# Patient Record
Sex: Male | Born: 1965 | Race: White | Hispanic: No | Marital: Married | Smoking: Never smoker
Health system: Southern US, Community
[De-identification: ages and names within clinical notes are randomized; demographics above are authoritative.]

## PROBLEM LIST (undated history)

## (undated) DIAGNOSIS — I10 Essential (primary) hypertension: Secondary | ICD-10-CM

## (undated) DIAGNOSIS — R7303 Prediabetes: Secondary | ICD-10-CM

## (undated) DIAGNOSIS — G43909 Migraine, unspecified, not intractable, without status migrainosus: Secondary | ICD-10-CM

## (undated) DIAGNOSIS — E785 Hyperlipidemia, unspecified: Secondary | ICD-10-CM

## (undated) DIAGNOSIS — G4730 Sleep apnea, unspecified: Secondary | ICD-10-CM

## (undated) HISTORY — PX: NO PAST SURGERIES: SHX2092

---

## 2011-05-24 ENCOUNTER — Emergency Department: Payer: Self-pay | Admitting: Emergency Medicine

## 2011-10-28 ENCOUNTER — Emergency Department: Payer: Self-pay | Admitting: Emergency Medicine

## 2011-10-28 LAB — CBC WITH DIFFERENTIAL/PLATELET
Basophil %: 0.6 %
Eosinophil #: 0.2 10*3/uL (ref 0.0–0.7)
HCT: 41.4 % (ref 40.0–52.0)
Lymphocyte #: 2.8 10*3/uL (ref 1.0–3.6)
MCHC: 33.3 g/dL (ref 32.0–36.0)
MCV: 83 fL (ref 80–100)
Monocyte %: 10.4 %
Neutrophil %: 53.3 %
Platelet: 298 10*3/uL (ref 150–440)
WBC: 8.6 10*3/uL (ref 3.8–10.6)

## 2011-10-28 LAB — URINALYSIS, COMPLETE
Bilirubin,UR: NEGATIVE
Blood: NEGATIVE
Glucose,UR: NEGATIVE mg/dL (ref 0–75)
Ketone: NEGATIVE
Leukocyte Esterase: NEGATIVE
Ph: 5 (ref 4.5–8.0)
RBC,UR: 1 /HPF (ref 0–5)
Squamous Epithelial: NONE SEEN
WBC UR: 7 /HPF (ref 0–5)

## 2011-10-28 LAB — COMPREHENSIVE METABOLIC PANEL
Albumin: 3.9 g/dL (ref 3.4–5.0)
Anion Gap: 7 (ref 7–16)
BUN: 21 mg/dL — ABNORMAL HIGH (ref 7–18)
Calcium, Total: 9.1 mg/dL (ref 8.5–10.1)
Co2: 28 mmol/L (ref 21–32)
EGFR (African American): >60
Glucose: 81 mg/dL (ref 65–99)
Osmolality: 285 (ref 275–301)
Potassium: 3.9 mmol/L (ref 3.5–5.1)
SGPT (ALT): 29 U/L
Sodium: 142 mmol/L (ref 136–145)
Total Protein: 7.5 g/dL (ref 6.4–8.2)

## 2013-04-23 IMAGING — CT CT STONE STUDY
1 of 2 series · 15 of 32 positions shown, 19 images · non-contrast
Comparison: none

REASON FOR EXAM: right flank pain
COMMENTS:

[Series 2: stone · axial · 0.84mm/px · z∈[-98,+370]mm · 15 of 170 slices shown, 19 images]
[im 7/170  soft-tissue]
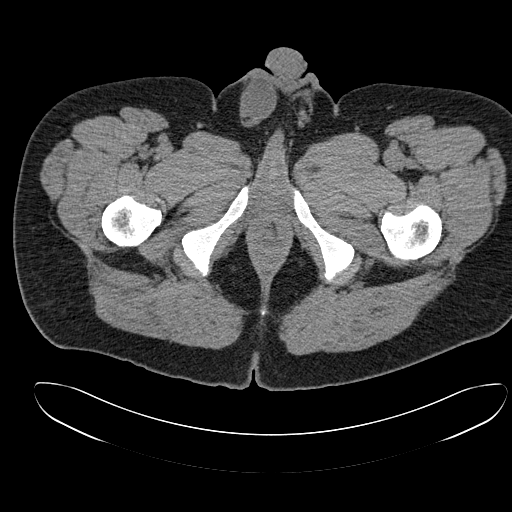
[im 7/170  bone]
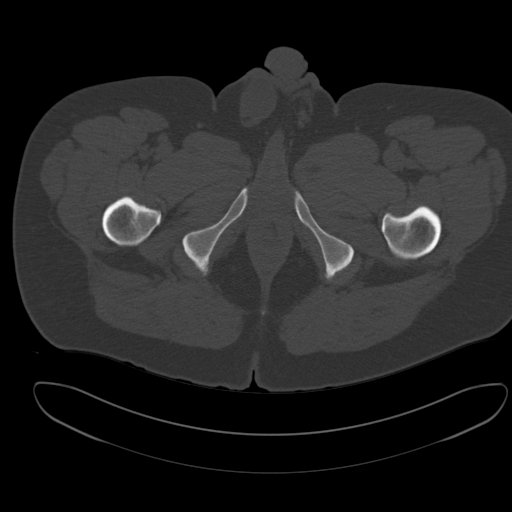
[im 20/170  soft-tissue]
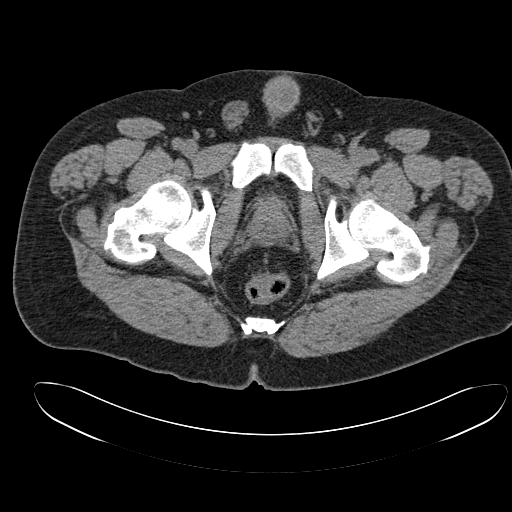
[im 33/170  soft-tissue]
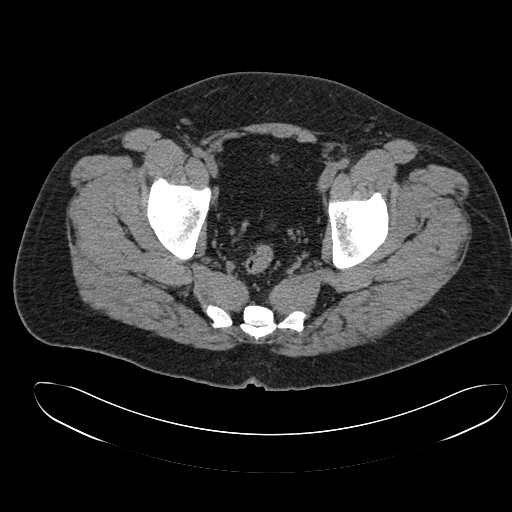
[im 46/170  soft-tissue]
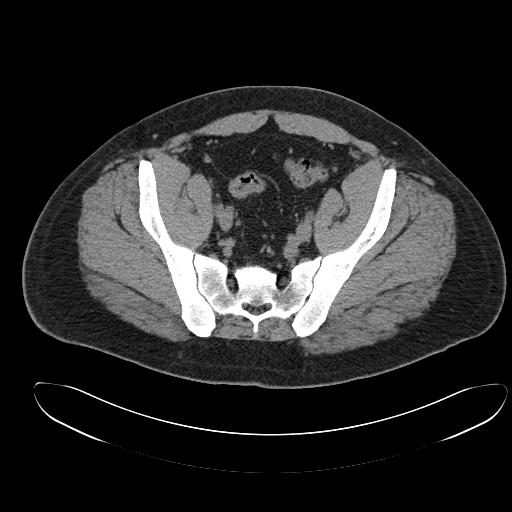
[im 59/170  soft-tissue]
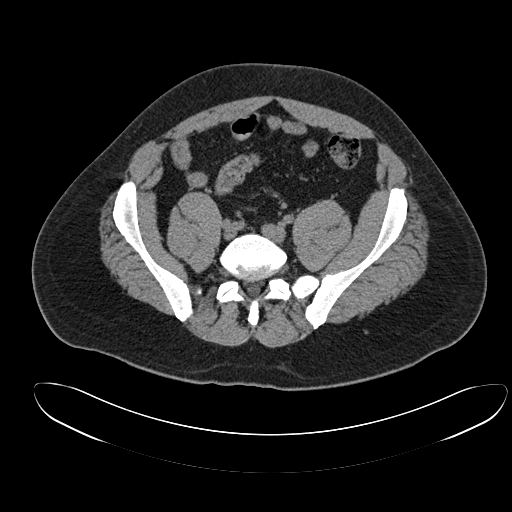
[im 72/170  soft-tissue]
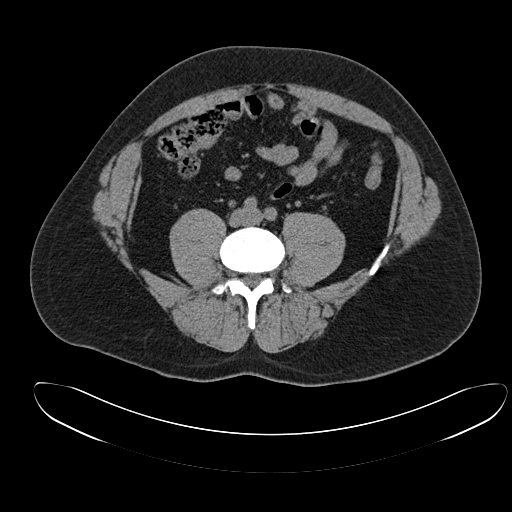
[im 85/170  soft-tissue]
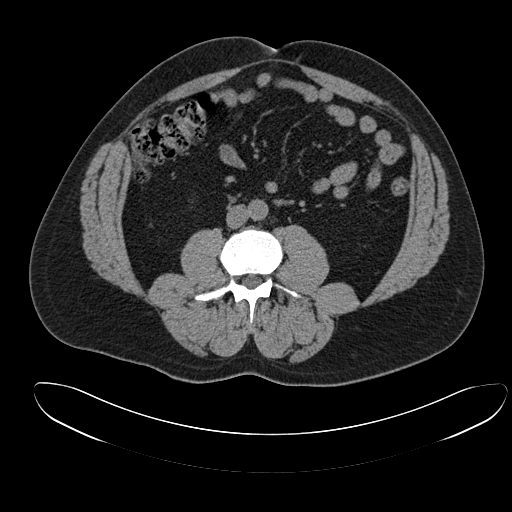
[im 98/170  soft-tissue]
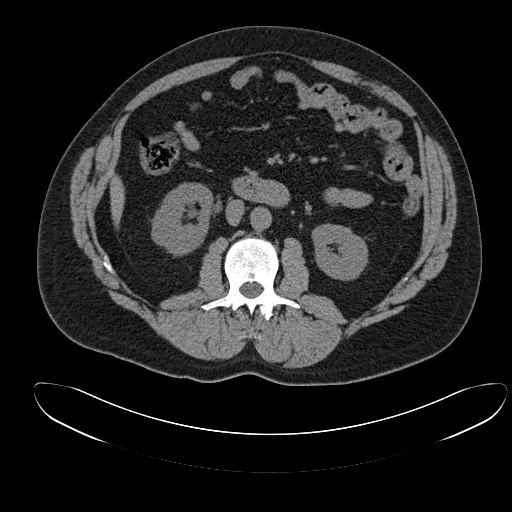
[im 111/170  soft-tissue]
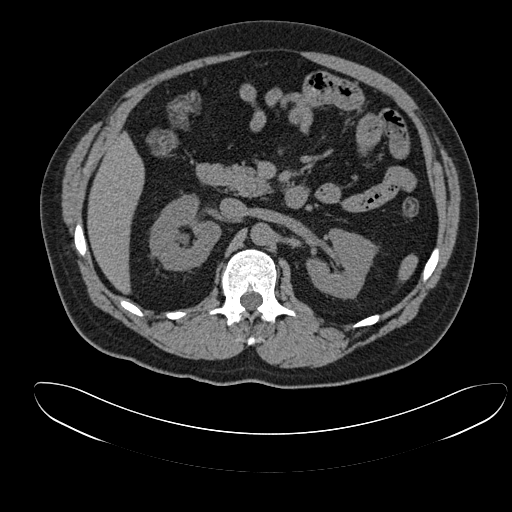
[im 111/170  bone]
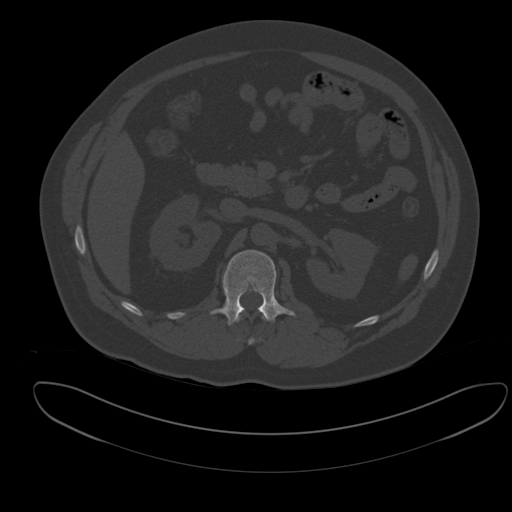
[im 124/170  soft-tissue]
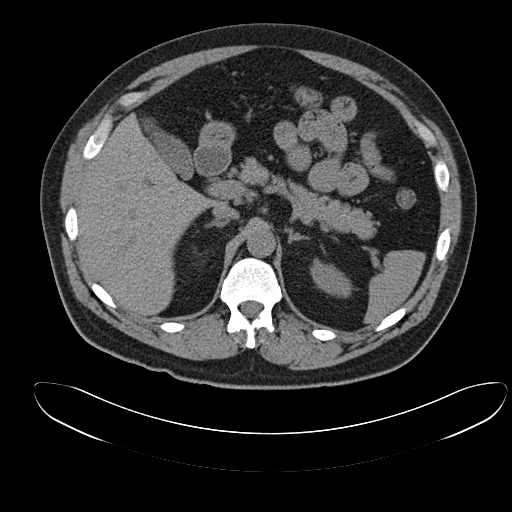
[im 137/170  soft-tissue]
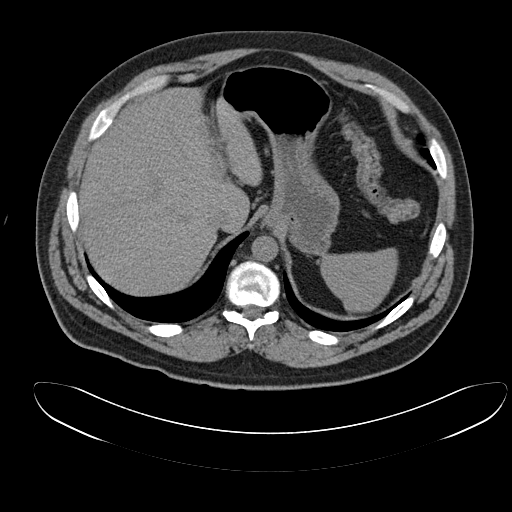
[im 144/170  lung]
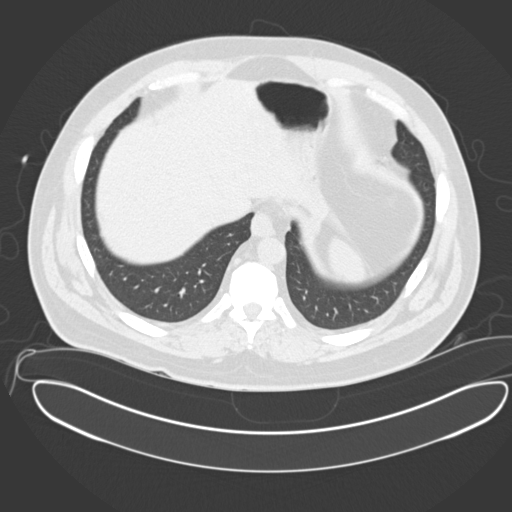
[im 150/170  soft-tissue]
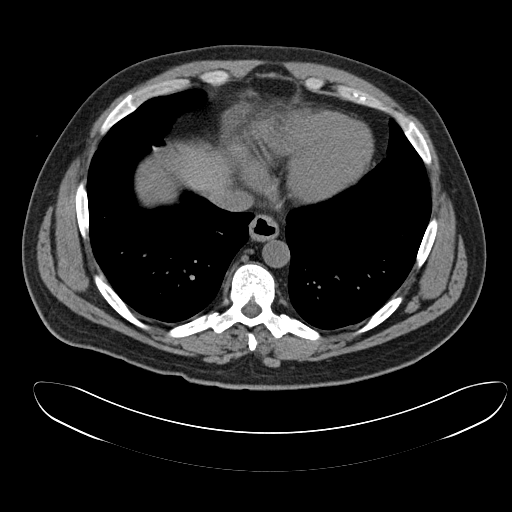
[im 150/170  lung]
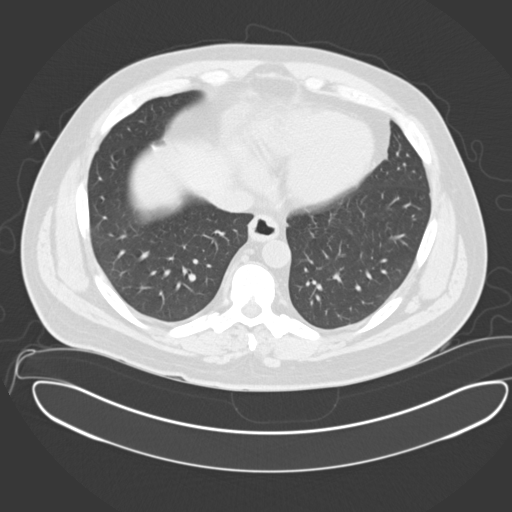
[im 157/170  lung]
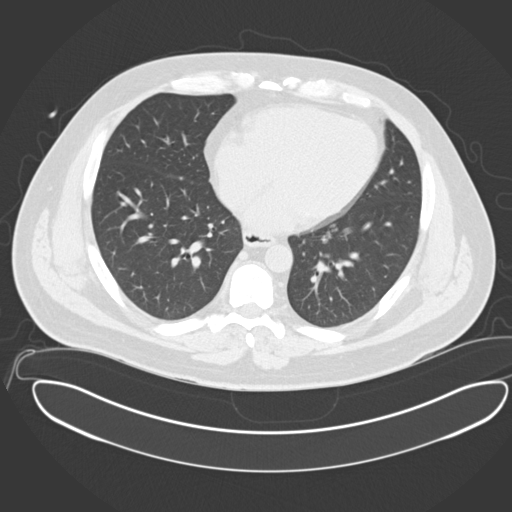
[im 163/170  soft-tissue]
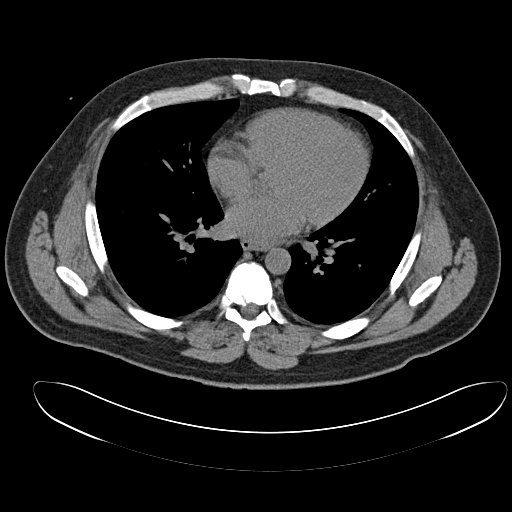
[im 163/170  lung]
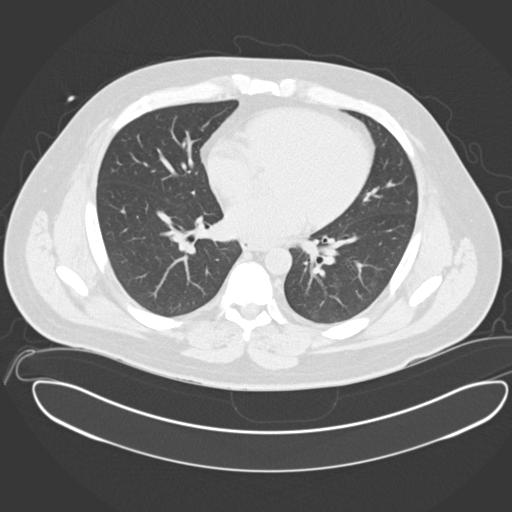

[15 of 32 positions shown; findings below may reference images not displayed]

PROCEDURE:     CT  - CT ABDOMEN /PELVIS WO (STONE)  - May 24, 2011  [DATE]

RESULT:     Axial noncontrast CT scanning was performed through the abdomen
and pelvis with reconstructions millimeter intervals and slice thicknesses.
Review of multiplanar reconstructed images was performed separately on the
VIA monitor.

There is mild hydronephrosis and hydroureter on the right secondary to an
approximately 3 mm diameter stone at the right ureterovesical junction. I
see no stones elsewhere. The left kidney exhibits no evidence of obstruction
nor calcified stones.

The liver, gallbladder, pancreas, spleen, partially distended stomach, and
adrenal glands are normal in appearance. The unopacified loops of small and
large bowel are normal in appearance. A structure consistent with a normal
appendix is demonstrated. There is no evidence of ascites. The lumbar
vertebral bodies are preserved in height. The lung bases are clear.
IMPRESSION: 1. There is mild hydronephrosis and hydroureter on the right secondary to a
3 mm diameter stone at the right ureterovesical junction.
2. I see no acute abnormality elsewhere within the abdomen or pelvis.

## 2013-09-27 IMAGING — CR DG ABDOMEN 2V
1 series · 3 of 3 positions shown · non-contrast
Comparison: none

REASON FOR EXAM: left flank pain  h/o kidney stone but on right
COMMENTS:

[Series 1: w abdomen upright · 0.14mm/px · 3 of 3 slices shown]
[im 1/3]
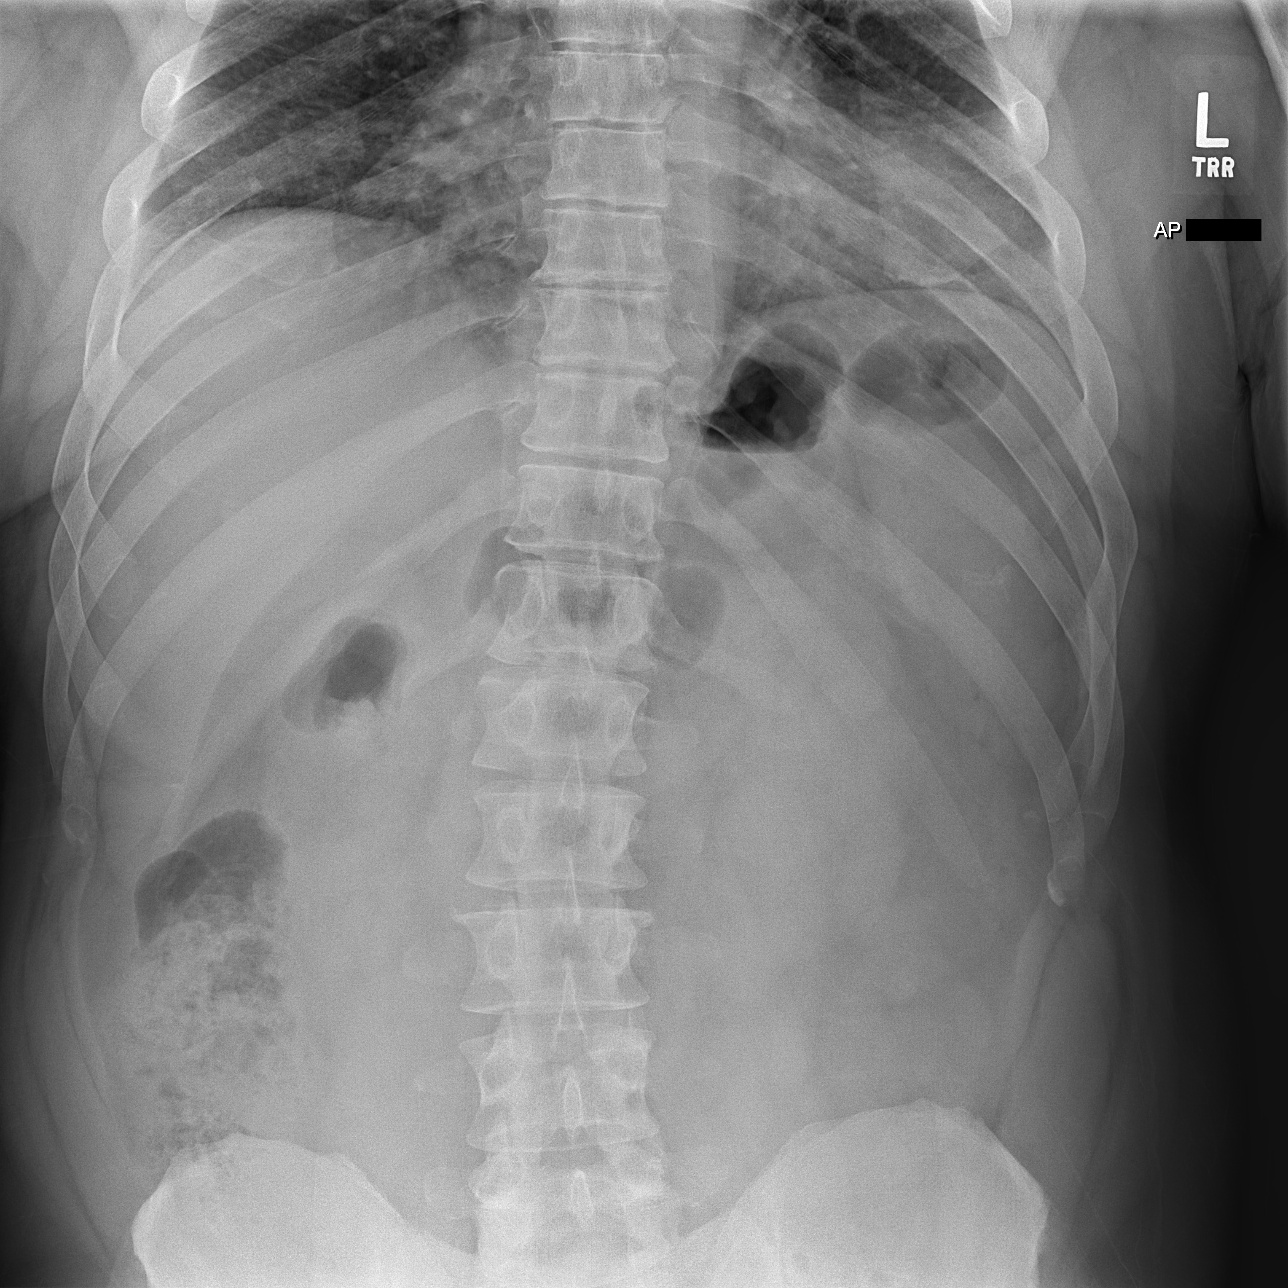
[im 2/3]
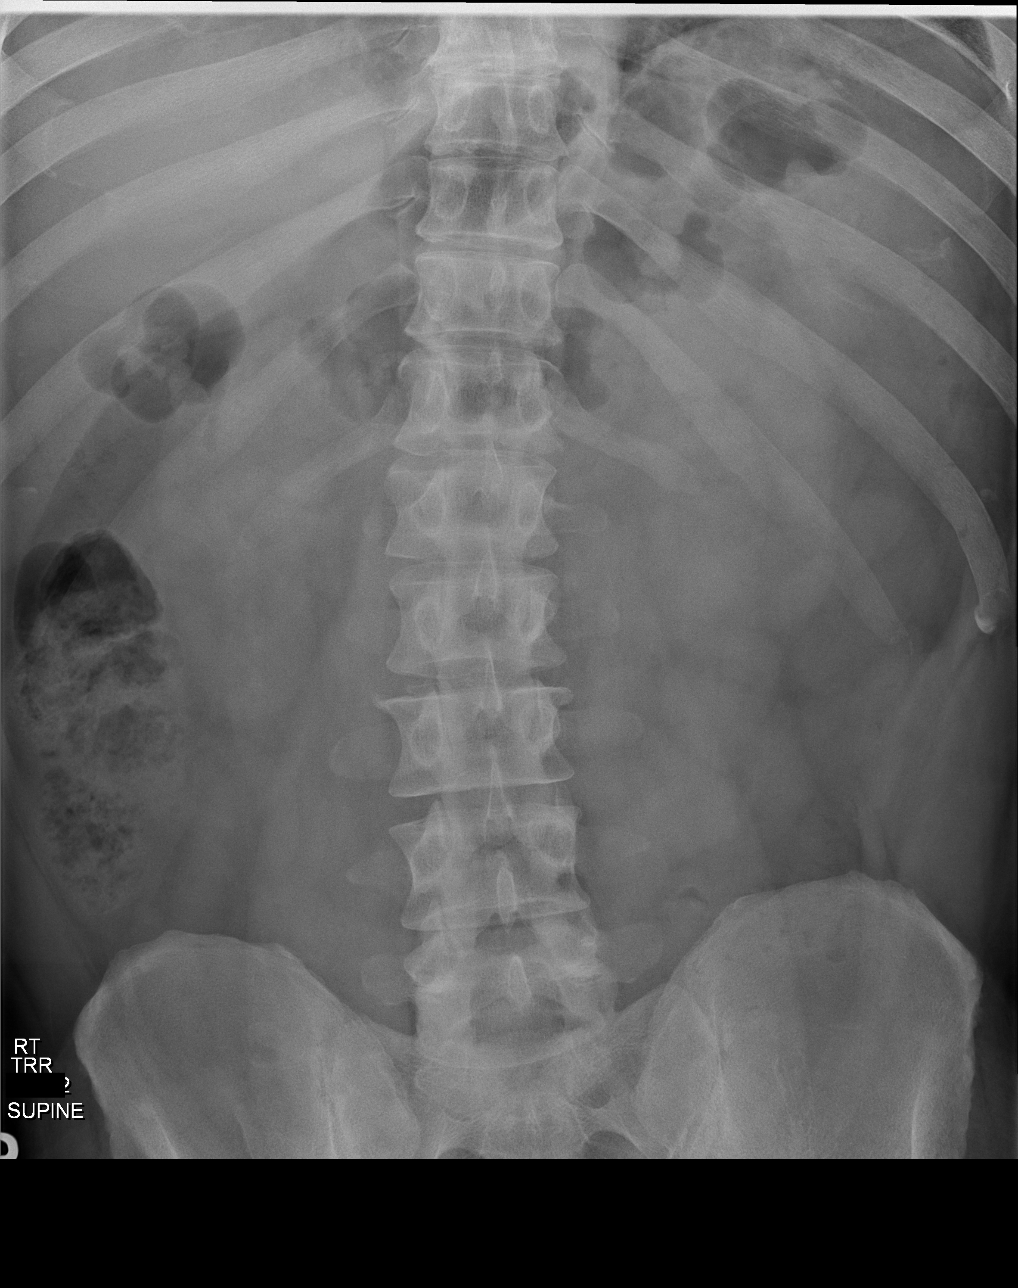
[im 3/3]
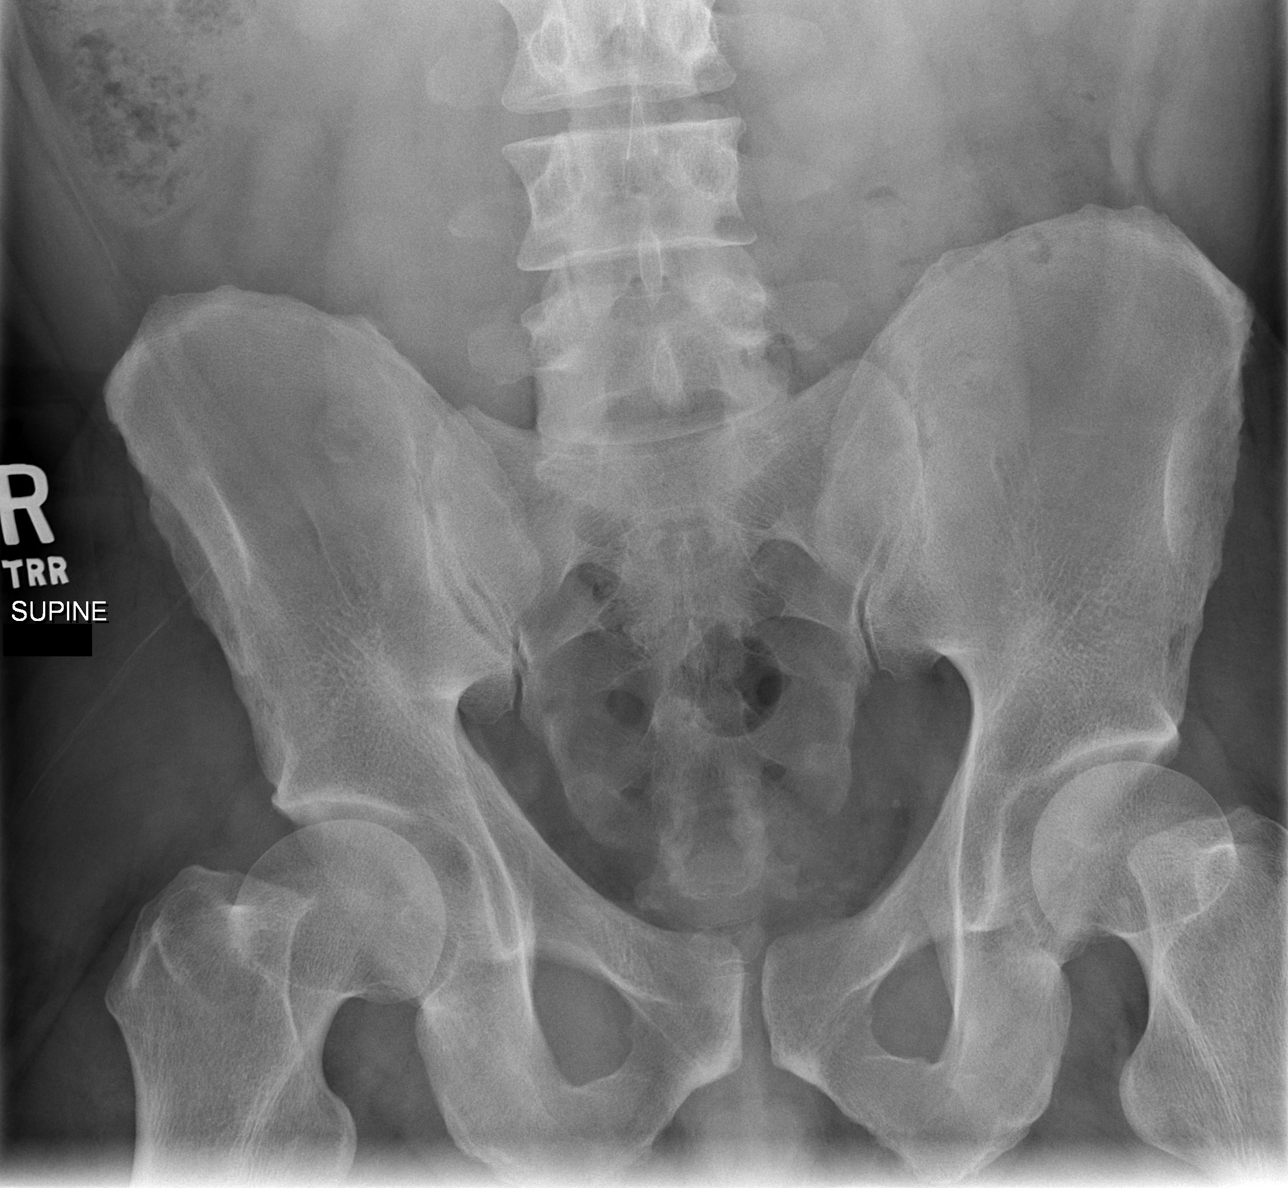

[3 of 3 positions shown; findings below may reference images not displayed]

PROCEDURE:     DXR - DXR ABDOMEN 2 V FLAT AND ERECT  - October 28, 2011 [DATE]

RESULT:     Flat and erect views were obtained. No subdiaphragmatic free air
is seen. No findings suspicious for bowel obstruction are noted. Gas is
visualized in the nondilated colon. There is a mild to moderate amount of
fecal material in the ascending colon. No abnormal intra-abdominal
calcifications are seen. There is a slight lumbar scoliosis with convexity
to the right.
IMPRESSION: No acute changes are identified.

[REDACTED]

## 2016-06-04 DIAGNOSIS — J3489 Other specified disorders of nose and nasal sinuses: Secondary | ICD-10-CM | POA: Diagnosis not present

## 2016-06-04 DIAGNOSIS — R0981 Nasal congestion: Secondary | ICD-10-CM | POA: Diagnosis not present

## 2016-06-15 DIAGNOSIS — G4733 Obstructive sleep apnea (adult) (pediatric): Secondary | ICD-10-CM | POA: Diagnosis not present

## 2016-06-15 DIAGNOSIS — M1A00X0 Idiopathic chronic gout, unspecified site, without tophus (tophi): Secondary | ICD-10-CM | POA: Diagnosis not present

## 2016-06-15 DIAGNOSIS — Z0000 Encounter for general adult medical examination without abnormal findings: Secondary | ICD-10-CM | POA: Diagnosis not present

## 2016-06-15 DIAGNOSIS — E7800 Pure hypercholesterolemia, unspecified: Secondary | ICD-10-CM | POA: Diagnosis not present

## 2016-06-15 DIAGNOSIS — Z125 Encounter for screening for malignant neoplasm of prostate: Secondary | ICD-10-CM | POA: Diagnosis not present

## 2016-07-28 DIAGNOSIS — Z1211 Encounter for screening for malignant neoplasm of colon: Secondary | ICD-10-CM | POA: Diagnosis not present

## 2017-08-24 DIAGNOSIS — R109 Unspecified abdominal pain: Secondary | ICD-10-CM | POA: Diagnosis not present

## 2018-04-11 DIAGNOSIS — M71342 Other bursal cyst, left hand: Secondary | ICD-10-CM | POA: Diagnosis not present

## 2018-04-11 DIAGNOSIS — L821 Other seborrheic keratosis: Secondary | ICD-10-CM | POA: Diagnosis not present

## 2018-04-11 DIAGNOSIS — D2261 Melanocytic nevi of right upper limb, including shoulder: Secondary | ICD-10-CM | POA: Diagnosis not present

## 2021-11-04 ENCOUNTER — Encounter: Payer: Self-pay | Admitting: Gastroenterology

## 2021-11-05 ENCOUNTER — Ambulatory Visit
Admission: RE | Admit: 2021-11-05 | Discharge: 2021-11-05 | Disposition: A | Discharge status: Home / Self Care | Payer: 59 | Attending: Gastroenterology | Admitting: Gastroenterology

## 2021-11-05 ENCOUNTER — Ambulatory Visit: Payer: 59 | Admitting: Anesthesiology

## 2021-11-05 ENCOUNTER — Ambulatory Visit: Admit: 2021-11-05 | Payer: Self-pay | Admitting: Internal Medicine

## 2021-11-05 ENCOUNTER — Encounter
Admission: RE | Disposition: A | Discharge status: Home / Self Care | Payer: Self-pay | Source: Home / Self Care | Attending: Gastroenterology

## 2021-11-05 ENCOUNTER — Encounter: Payer: Self-pay | Admitting: Gastroenterology

## 2021-11-05 DIAGNOSIS — Z1211 Encounter for screening for malignant neoplasm of colon: Secondary | ICD-10-CM | POA: Insufficient documentation

## 2021-11-05 DIAGNOSIS — I10 Essential (primary) hypertension: Secondary | ICD-10-CM | POA: Insufficient documentation

## 2021-11-05 DIAGNOSIS — K635 Polyp of colon: Secondary | ICD-10-CM | POA: Insufficient documentation

## 2021-11-05 DIAGNOSIS — K640 First degree hemorrhoids: Secondary | ICD-10-CM | POA: Insufficient documentation

## 2021-11-05 DIAGNOSIS — D123 Benign neoplasm of transverse colon: Secondary | ICD-10-CM | POA: Diagnosis not present

## 2021-11-05 DIAGNOSIS — D124 Benign neoplasm of descending colon: Secondary | ICD-10-CM | POA: Insufficient documentation

## 2021-11-05 DIAGNOSIS — K5730 Diverticulosis of large intestine without perforation or abscess without bleeding: Secondary | ICD-10-CM | POA: Insufficient documentation

## 2021-11-05 DIAGNOSIS — G4730 Sleep apnea, unspecified: Secondary | ICD-10-CM | POA: Diagnosis not present

## 2021-11-05 HISTORY — DX: Hyperlipidemia, unspecified: E78.5

## 2021-11-05 HISTORY — DX: Prediabetes: R73.03

## 2021-11-05 HISTORY — DX: Essential (primary) hypertension: I10

## 2021-11-05 HISTORY — DX: Migraine, unspecified, not intractable, without status migrainosus: G43.909

## 2021-11-05 HISTORY — PX: COLONOSCOPY WITH PROPOFOL: SHX5780

## 2021-11-05 HISTORY — DX: Sleep apnea, unspecified: G47.30

## 2021-11-05 SURGERY — COLONOSCOPY WITH PROPOFOL
Anesthesia: General

## 2021-11-05 MED ORDER — PROPOFOL 500 MG/50ML IV EMUL
INTRAVENOUS | Status: DC | PRN
  Administered 2021-11-05: 145 ug/kg/min via INTRAVENOUS

## 2021-11-05 MED ORDER — SODIUM CHLORIDE 0.9 % IV SOLN: INTRAVENOUS | Status: DC

## 2021-11-05 MED ORDER — LIDOCAINE HCL (CARDIAC) PF 100 MG/5ML IV SOSY
PREFILLED_SYRINGE | INTRAVENOUS | Status: DC | PRN
  Administered 2021-11-05: 100 mg via INTRAVENOUS

## 2021-11-05 MED ORDER — PROPOFOL 500 MG/50ML IV EMUL
INTRAVENOUS | Status: AC
  Filled 2021-11-05: qty 50

## 2021-11-05 MED ORDER — OXYMETAZOLINE HCL 0.05 % NA SOLN
NASAL | Status: AC
  Filled 2021-11-05: qty 30

## 2021-11-05 MED ORDER — PHENYLEPHRINE 80 MCG/ML (10ML) SYRINGE FOR IV PUSH (FOR BLOOD PRESSURE SUPPORT)
PREFILLED_SYRINGE | INTRAVENOUS | Status: DC | PRN
  Administered 2021-11-05: 160 ug via INTRAVENOUS

## 2021-11-05 MED ORDER — GLYCOPYRROLATE 0.2 MG/ML IJ SOLN
INTRAMUSCULAR | Status: DC | PRN
  Administered 2021-11-05: .2 mg via INTRAVENOUS

## 2021-11-05 MED ORDER — DEXMEDETOMIDINE HCL IN NACL 200 MCG/50ML IV SOLN
INTRAVENOUS | Status: DC | PRN
  Administered 2021-11-05: 12 ug via INTRAVENOUS

## 2021-11-05 MED ORDER — MIDAZOLAM HCL 2 MG/2ML IJ SOLN
INTRAMUSCULAR | Status: DC | PRN
  Administered 2021-11-05: 2 mg via INTRAVENOUS

## 2021-11-05 MED ORDER — PROPOFOL 10 MG/ML IV BOLUS
INTRAVENOUS | Status: DC | PRN
  Administered 2021-11-05: 30 mg via INTRAVENOUS
  Administered 2021-11-05: 70 mg via INTRAVENOUS

## 2021-11-05 NOTE — H&P (Signed)
Pre-Procedure H&P   Patient ID: Bradley Dillon is a 56 y.o. male.  Gastroenterology Provider: Annamaria Helling, DO  Referring Provider: Dr. Ouida Sills PCP: Frazier Richards, MD  Date: 11/05/2021  HPI Bradley Dillon is a 56 y.o. male who presents today for Colonoscopy for Initial screening colonoscopy. Patient has daily bowel movement without melena hematochezia constipation or diarrhea.  No family history of colorectal cancer or colon polyps.  No acute GI complaints. Most recent labs creatinine 1.2 A1c 5.9 Taking meloxicam  Past Medical History:  Diagnosis Date   HLD (hyperlipidemia)    Hypertension    Migraine    Pre-diabetes    Sleep apnea     Past Surgical History:  Procedure Laterality Date   NO PAST SURGERIES      Family History No h/o GI disease or malignancy  Review of Systems  Constitutional:  Negative for activity change, appetite change, chills, diaphoresis, fatigue, fever and unexpected weight change.  HENT:  Negative for trouble swallowing and voice change.   Respiratory:  Negative for shortness of breath and wheezing.   Cardiovascular:  Negative for chest pain, palpitations and leg swelling.  Gastrointestinal:  Negative for abdominal distention, abdominal pain, anal bleeding, blood in stool, constipation, diarrhea, nausea and vomiting.  Musculoskeletal:  Negative for arthralgias and myalgias.  Skin:  Negative for color change and pallor.  Neurological:  Negative for dizziness, syncope and weakness.  Psychiatric/Behavioral:  Negative for confusion. The patient is not nervous/anxious.   All other systems reviewed and are negative.    Medications No current facility-administered medications on file prior to encounter.   Current Outpatient Medications on File Prior to Encounter  Medication Sig Dispense Refill   amLODipine (NORVASC) 5 MG tablet Take 5 mg by mouth daily.     colchicine-probenecid 0.5-500 MG tablet Take 1 tablet by mouth daily.      fluticasone (FLONASE) 50 MCG/ACT nasal spray Place into both nostrils daily.     meloxicam (MOBIC) 15 MG tablet Take 15 mg by mouth daily.     propranolol (INDERAL) 80 MG tablet Take 80 mg by mouth 2 (two) times daily.      Pertinent medications related to GI and procedure were reviewed by me with the patient prior to the procedure   Current Facility-Administered Medications:    0.9 %  sodium chloride infusion, , Intravenous, Continuous, Annamaria Helling, DO, Last Rate: 20 mL/hr at 11/05/21 0854, New Bag at 11/05/21 0854      Not on File Allergies were reviewed by me prior to the procedure  Objective   Body mass index is 39.87 kg/m. Vitals:   11/05/21 0841  BP: (!) 158/99  Pulse: (!) 59  Resp: 18  Temp: (!) 96.8 F (36 C)  TempSrc: Temporal  SpO2: 97%  Weight: 122.5 kg  Height: '5\' 9"'$  (1.753 m)     Physical Exam Vitals and nursing note reviewed.  Constitutional:      General: He is not in acute distress.    Appearance: Normal appearance. He is obese. He is not ill-appearing, toxic-appearing or diaphoretic.  HENT:     Head: Normocephalic and atraumatic.     Nose: Nose normal.     Mouth/Throat:     Mouth: Mucous membranes are moist.     Pharynx: Oropharynx is clear.  Eyes:     General: No scleral icterus.    Extraocular Movements: Extraocular movements intact.  Cardiovascular:     Rate and Rhythm: Regular rhythm.  Bradycardia present.     Heart sounds: Normal heart sounds. No murmur heard.    No friction rub. No gallop.  Pulmonary:     Effort: Pulmonary effort is normal. No respiratory distress.     Breath sounds: Normal breath sounds. No wheezing, rhonchi or rales.  Abdominal:     General: Bowel sounds are normal. There is no distension.     Palpations: Abdomen is soft.     Tenderness: There is no abdominal tenderness. There is no guarding or rebound.     Comments: protuberant  Musculoskeletal:     Cervical back: Neck supple.     Right lower leg: No  edema.     Left lower leg: No edema.  Skin:    General: Skin is warm and dry.     Coloration: Skin is not jaundiced or pale.  Neurological:     General: No focal deficit present.     Mental Status: He is alert and oriented to person, place, and time. Mental status is at baseline.  Psychiatric:        Mood and Affect: Mood normal.        Behavior: Behavior normal.        Thought Content: Thought content normal.        Judgment: Judgment normal.      Assessment:  Bradley Dillon is a 56 y.o. male  who presents today for Colonoscopy for Initial screening colonoscopy.  Plan:  Colonoscopy with possible intervention today  Colonoscopy with possible biopsy, control of bleeding, polypectomy, and interventions as necessary has been discussed with the patient/patient representative. Informed consent was obtained from the patient/patient representative after explaining the indication, nature, and risks of the procedure including but not limited to death, bleeding, perforation, missed neoplasm/lesions, cardiorespiratory compromise, and reaction to medications. Opportunity for questions was given and appropriate answers were provided. Patient/patient representative has verbalized understanding is amenable to undergoing the procedure.   Annamaria Helling, DO  [facility]  Portions of the record may have been created with voice recognition software. Occasional wrong-word or 'sound-a-like' substitutions may have occurred due to the inherent limitations of voice recognition software.  Read the chart carefully and recognize, using context, where substitutions may have occurred.

## 2021-11-05 NOTE — Anesthesia Postprocedure Evaluation (Signed)
Anesthesia Post Note  Patient: Bradley Dillon  Procedure(s) Performed: COLONOSCOPY WITH PROPOFOL  Patient location during evaluation: Endoscopy Anesthesia Type: General Level of consciousness: awake and alert Pain management: pain level controlled Vital Signs Assessment: post-procedure vital signs reviewed and stable Respiratory status: spontaneous breathing, nonlabored ventilation, respiratory function stable and patient connected to nasal cannula oxygen Cardiovascular status: blood pressure returned to baseline and stable Postop Assessment: no apparent nausea or vomiting Anesthetic complications: no   No notable events documented.   Last Vitals:  Vitals:   11/05/21 1002 11/05/21 1012  BP: 103/68 108/74  Pulse: (!) 58 68  Resp: 18 (!) 25  Temp:    SpO2: 100% 99%    Last Pain:  Vitals:   11/05/21 1012  TempSrc:   PainSc: 0-No pain                 Arita Miss

## 2021-11-05 NOTE — Transfer of Care (Signed)
Immediate Anesthesia Transfer of Care Note  Patient: Bradley Dillon  Procedure(s) Performed: COLONOSCOPY WITH PROPOFOL  Patient Location: Endoscopy Unit  Anesthesia Type:General  Level of Consciousness: drowsy and patient cooperative  Airway & Oxygen Therapy: Patient Spontanous Breathing and Patient connected to face mask oxygen  Post-op Assessment: Report given to RN and Post -op Vital signs reviewed and stable  Post vital signs: Reviewed and stable  Last Vitals:  Vitals Value Taken Time  BP 94/64 11/05/21 0952  Temp 36.3 C 11/05/21 0952  Pulse 64 11/05/21 0954  Resp 19 11/05/21 0954  SpO2 100 % 11/05/21 0954  Vitals shown include unvalidated device data.  Last Pain:  Vitals:   11/05/21 0952  TempSrc: Temporal  PainSc: Asleep         Complications: No notable events documented.

## 2021-11-05 NOTE — Anesthesia Procedure Notes (Addendum)
Procedure Name: General with mask airway Date/Time: 11/05/2021 9:20 AM  Performed by: Kelton Pillar, CRNAPre-anesthesia Checklist: Patient identified, Emergency Drugs available, Suction available and Patient being monitored Patient Re-evaluated:Patient Re-evaluated prior to induction Oxygen Delivery Method: Simple face mask Induction Type: IV induction Ventilation: Oral airway inserted - appropriate to patient size Placement Confirmation: positive ETCO2, CO2 detector and breath sounds checked- equal and bilateral Dental Injury: Teeth and Oropharynx as per pre-operative assessment

## 2021-11-05 NOTE — Anesthesia Preprocedure Evaluation (Signed)
Anesthesia Evaluation  Patient identified by MRN, date of birth, ID band Patient awake    Reviewed: Allergy & Precautions, NPO status , Patient's Chart, lab work & pertinent test results  History of Anesthesia Complications Negative for: history of anesthetic complications  Airway Mallampati: III  TM Distance: >3 FB Neck ROM: Full    Dental no notable dental hx. (+) Teeth Intact   Pulmonary neg pulmonary ROS, sleep apnea and Continuous Positive Airway Pressure Ventilation , neg COPD, Patient abstained from smoking.Not current smoker,    Pulmonary exam normal breath sounds clear to auscultation       Cardiovascular Exercise Tolerance: Good METShypertension, Pt. on medications (-) CAD and (-) Past MI (-) dysrhythmias  Rhythm:Regular Rate:Normal - Systolic murmurs    Neuro/Psych  Headaches, negative neurological ROS  negative psych ROS   GI/Hepatic neg GERD  ,(+)     (-) substance abuse  ,   Endo/Other  neg diabetes  Renal/GU negative Renal ROS     Musculoskeletal   Abdominal (+) + obese,   Peds  Hematology   Anesthesia Other Findings Past Medical History: No date: HLD (hyperlipidemia) No date: Hypertension No date: Migraine No date: Pre-diabetes No date: Sleep apnea  Reproductive/Obstetrics                             Anesthesia Physical Anesthesia Plan  ASA: 2  Anesthesia Plan: General   Post-op Pain Management: Minimal or no pain anticipated   Induction: Intravenous  PONV Risk Score and Plan: 2 and Propofol infusion, TIVA and Ondansetron  Airway Management Planned: Nasal Cannula  Additional Equipment: None  Intra-op Plan:   Post-operative Plan:   Informed Consent: I have reviewed the patients History and Physical, chart, labs and discussed the procedure including the risks, benefits and alternatives for the proposed anesthesia with the patient or authorized  representative who has indicated his/her understanding and acceptance.     Dental advisory given  Plan Discussed with: CRNA and Surgeon  Anesthesia Plan Comments: (Discussed risks of anesthesia with patient, including possibility of difficulty with spontaneous ventilation under anesthesia necessitating airway intervention, PONV, and rare risks such as cardiac or respiratory or neurological events, and allergic reactions. Discussed the role of CRNA in patient's perioperative care. Patient understands.)        Anesthesia Quick Evaluation

## 2021-11-05 NOTE — Interval H&P Note (Signed)
History and Physical Interval Note: Preprocedure H&P from 11/05/21  was reviewed and there was no interval change after seeing and examining the patient.  Written consent was obtained from the patient after discussion of risks, benefits, and alternatives. Patient has consented to proceed with Colonoscopy with possible intervention   11/05/2021 8:57 AM  Bradley Dillon  has presented today for surgery, with the diagnosis of Z12.11  - Colon cancer screening.  The various methods of treatment have been discussed with the patient and family. After consideration of risks, benefits and other options for treatment, the patient has consented to  Procedure(s): COLONOSCOPY WITH PROPOFOL (N/A) as a surgical intervention.  The patient's history has been reviewed, patient examined, no change in status, stable for surgery.  I have reviewed the patient's chart and labs.  Questions were answered to the patient's satisfaction.     Annamaria Helling

## 2021-11-05 NOTE — Op Note (Signed)
[facility] Gastroenter[facility] Patient Name: Bradley Dillon Procedure Date: 11/05/2021 8:54 AM MRN: [id number] Account #: [id number] Date of Birth: 1965/07/24 Admit Type: Outpatient Age: 57 Room: [facility] ENDO ROOM 1 Gender: Male Note Status: Finalized Instrument Name: Colonoscope [id number] Procedure:             Colonoscopy Indications:           Screening for colorectal malignant neoplasm Providers:             Rueben Bash, DO Referring MD:          Ocie Cornfield. Ouida Sills MD, MD (Referring MD) Medicines:             Monitored Anesthesia Care Complications:         No immediate complications. Estimated blood loss:                         Minimal. Procedure:             Pre-Anesthesia Assessment:                        - Prior to the procedure, a History and Physical was                         performed, and patient medications and allergies were                         reviewed. The patient is competent. The risks and                         benefits of the procedure and the sedation options and                         risks were discussed with the patient. All questions                         were answered and informed consent was obtained.                         Patient identification and proposed procedure were                         verified by the physician, the nurse, the anesthetist                         and the technician in the endoscopy suite. Mental                         Status Examination: alert and oriented. Airway                         Examination: small/crowded oropharyngeal airway.                         Respiratory Examination: clear to auscultation. CV                         Examination: RRR, no murmurs, no S3 or S4.  Prophylactic Antibiotics: The patient does not require                         prophylactic antibiotics. Prior Anticoagulants: The                         patient has taken no previous anticoagulant  or                         antiplatelet agents. ASA Grade Assessment: II - A                         patient with mild systemic disease. After reviewing                         the risks and benefits, the patient was deemed in                         satisfactory condition to undergo the procedure. The                         anesthesia plan was to use monitored anesthesia care                         (MAC). Immediately prior to administration of                         medications, the patient was re-assessed for adequacy                         to receive sedatives. The heart rate, respiratory                         rate, oxygen saturations, blood pressure, adequacy of                         pulmonary ventilation, and response to care were                         monitored throughout the procedure. The physical                         status of the patient was re-assessed after the                         procedure.                        After obtaining informed consent, the colonoscope was                         passed under direct vision. Throughout the procedure,                         the patient's blood pressure, pulse, and oxygen                         saturations were monitored continuously. The  Colonoscope was introduced through the anus and                         advanced to the the terminal ileum, with                         identification of the appendiceal orifice and IC                         valve. The colonoscopy was performed without                         difficulty. The patient tolerated the procedure well.                         The quality of the bowel preparation was evaluated                         using the BBPS [facility] Bowel Preparation Scale) with                         scores of: Right Colon = 3, Transverse Colon = 3 and                         Left Colon = 3 (entire mucosa seen well with no                         residual  staining, small fragments of stool or opaque                         liquid). The total BBPS score equals 9. The terminal                         ileum, ileocecal valve, appendiceal orifice, and                         rectum were photographed. Findings:      The perianal and digital rectal examinations were normal. Pertinent       negatives include normal sphincter tone.      The terminal ileum appeared normal. Estimated blood loss: none.      Multiple small-mouthed diverticula were found in the left colon.       Estimated blood loss: none.      Non-bleeding internal hemorrhoids were found during retroflexion. The       hemorrhoids were Grade I (internal hemorrhoids that do not prolapse).       Estimated blood loss: none.      Seven sessile polyps were found in the sigmoid colon (3), descending       colon (2) and transverse colon (2). The polyps were 2 to 6 mm in size.       These polyps were removed with a cold snare. Resection and retrieval       were complete. Estimated blood loss was minimal.      Two sessile polyps were found in the sigmoid colon. The polyps were 1 to       2 mm in size. These polyps were removed with a jumbo cold forceps.  Resection and retrieval were complete. Estimated blood loss was minimal.      The exam was otherwise without abnormality on direct and retroflexion       views. Impression:            - The examined portion of the ileum was normal.                        - Diverticulosis in the left colon.                        - Non-bleeding internal hemorrhoids.                        - Seven 2 to 6 mm polyps in the sigmoid colon, in the                         descending colon and in the transverse colon, removed                         with a cold snare. Resected and retrieved.                        - Two 1 to 2 mm polyps in the sigmoid colon, removed                         with a jumbo cold forceps. Resected and retrieved.                        -  The examination was otherwise normal on direct and                         retroflexion views. Recommendation:        - Discharge patient to home.                        - Resume previous diet.                        - Continue present medications.                        - No aspirin, ibuprofen, naproxen, or other                         non-steroidal anti-inflammatory drugs for 5 days after                         polyp removal.                        - Await pathology results.                        - Repeat colonoscopy for surveillance based on                         pathology results.                        - Return to referring physician as  previously                         scheduled.                        - The findings and recommendations were discussed with                         the patient. Procedure Code(s):     --- Professional ---                        [phone], Colonoscopy, flexible; with removal of                         tumor(s), polyp(s), or other lesion(s) by snare                         technique                        [id number], 43, Colonoscopy, flexible; with biopsy, single                         or multiple Diagnosis Code(s):     --- Professional ---                        Z12.11, Encounter for screening for malignant neoplasm                         of colon                        K64.0, First degree hemorrhoids                        K63.5, Polyp of colon                        K57.30, Diverticulosis of large intestine without                         perforation or abscess without bleeding CPT copyright 2019 [employer]. All rights reserved. The codes documented in this report are preliminary and upon coder review may  be revised to meet current compliance requirements. Attending Participation:      I personally performed the entire procedure. Volney American, DO Annamaria Helling DO, DO 11/05/2021 9:54:02 AM This report has been signed  electronically. Number of Addenda: 0 Note Initiated On: 11/05/2021 8:54 AM Scope Withdrawal Time: 0 hours 35 minutes 34 seconds  Total Procedure Duration: 0 hours 38 minutes 34 seconds  Estimated Blood Loss:  Estimated blood loss was minimal.      [facility]

## 2021-11-06 ENCOUNTER — Encounter: Payer: Self-pay | Admitting: Gastroenterology

## 2021-11-06 LAB — SURGICAL PATHOLOGY

## 2022-04-20 LAB — SURGICAL PATHOLOGY
